# Patient Record
Sex: Male | Born: 1995 | Race: White | Hispanic: No | Marital: Single | Smoking: Current every day smoker
Health system: Southern US, Community
[De-identification: ages and names within clinical notes are randomized; demographics above are authoritative.]

## PROBLEM LIST (undated history)

## (undated) DIAGNOSIS — F319 Bipolar disorder, unspecified: Secondary | ICD-10-CM

## (undated) DIAGNOSIS — F209 Schizophrenia, unspecified: Secondary | ICD-10-CM

## (undated) DIAGNOSIS — F909 Attention-deficit hyperactivity disorder, unspecified type: Secondary | ICD-10-CM

---
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
## 2016-07-11 ENCOUNTER — Emergency Department
Admission: EM | Admit: 2016-07-11 | Discharge: 2016-07-12 | Disposition: A | Discharge status: Home / Self Care | Payer: No Typology Code available for payment source | Attending: Emergency Medicine | Admitting: Emergency Medicine

## 2016-07-11 ENCOUNTER — Encounter: Payer: Self-pay | Admitting: Emergency Medicine

## 2016-07-11 DIAGNOSIS — S060X0A Concussion without loss of consciousness, initial encounter: Secondary | ICD-10-CM | POA: Diagnosis not present

## 2016-07-11 DIAGNOSIS — F17210 Nicotine dependence, cigarettes, uncomplicated: Secondary | ICD-10-CM | POA: Insufficient documentation

## 2016-07-11 DIAGNOSIS — Y939 Activity, unspecified: Secondary | ICD-10-CM | POA: Diagnosis not present

## 2016-07-11 DIAGNOSIS — Y999 Unspecified external cause status: Secondary | ICD-10-CM | POA: Insufficient documentation

## 2016-07-11 DIAGNOSIS — Y92410 Unspecified street and highway as the place of occurrence of the external cause: Secondary | ICD-10-CM | POA: Diagnosis not present

## 2016-07-11 DIAGNOSIS — S0990XA Unspecified injury of head, initial encounter: Secondary | ICD-10-CM | POA: Diagnosis present
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
## 2016-07-12 ENCOUNTER — Emergency Department: Payer: No Typology Code available for payment source

## 2016-07-12 MED ORDER — ONDANSETRON 4 MG PO TBDP
4.0000 mg | ORAL_TABLET | Freq: Once | ORAL | Status: AC
  Administered 2016-07-12: 4 mg via ORAL

## 2016-07-12 MED ORDER — ONDANSETRON 4 MG PO TBDP
ORAL_TABLET | ORAL | Status: AC
  Filled 2016-07-12: qty 1

## 2016-07-12 MED ORDER — OXYCODONE-ACETAMINOPHEN 5-325 MG PO TABS
1.0000 | ORAL_TABLET | Freq: Once | ORAL | Status: AC
  Administered 2016-07-12: 1 via ORAL
  Filled 2016-07-12: qty 1

## 2016-07-12 MED ORDER — CYCLOBENZAPRINE HCL 10 MG PO TABS: 10.0000 mg | ORAL_TABLET | Freq: Three times a day (TID) | ORAL | 0 refills | Status: DC | PRN
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
## 2016-09-15 ENCOUNTER — Emergency Department
Admission: EM | Admit: 2016-09-15 | Discharge: 2016-09-15 | Discharge status: Against Medical Advice | Payer: Self-pay | Attending: Emergency Medicine | Admitting: Emergency Medicine

## 2016-09-15 DIAGNOSIS — L509 Urticaria, unspecified: Secondary | ICD-10-CM

## 2016-09-15 DIAGNOSIS — T7840XA Allergy, unspecified, initial encounter: Secondary | ICD-10-CM

## 2016-09-15 DIAGNOSIS — L500 Allergic urticaria: Secondary | ICD-10-CM | POA: Insufficient documentation

## 2016-09-15 DIAGNOSIS — F17210 Nicotine dependence, cigarettes, uncomplicated: Secondary | ICD-10-CM | POA: Insufficient documentation

## 2016-09-15 MED ORDER — DIPHENHYDRAMINE HCL 50 MG/ML IJ SOLN
INTRAMUSCULAR | Status: AC
  Administered 2016-09-15: 50 mg via INTRAVENOUS
  Filled 2016-09-15: qty 1

## 2016-09-15 MED ORDER — EPINEPHRINE 0.3 MG/0.3ML IJ SOAJ: 0.3000 mg | Freq: Once | INTRAMUSCULAR | 1 refills | Status: AC

## 2016-09-15 MED ORDER — DIPHENHYDRAMINE HCL 25 MG PO CAPS
ORAL_CAPSULE | ORAL | Status: AC
  Filled 2016-09-15: qty 1

## 2016-09-15 MED ORDER — DIPHENHYDRAMINE HCL 50 MG/ML IJ SOLN
50.0000 mg | Freq: Once | INTRAMUSCULAR | Status: AC
  Administered 2016-09-15: 50 mg via INTRAVENOUS

## 2016-09-15 NOTE — ED Provider Notes (Signed)
[facility] Emergency Department Provider Note  ____________________________________________  Time seen: Approximately 4:21 AM  I have reviewed the triage vital signs and the nursing notes.   HISTORY  Chief Complaint Rash   HPI Eddie Garcia is a 20 y.o. 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 The patient reports that he noticed hives all over his body starting at 9 PM. He does not remember eating anything with red dye. He denies any new soaps, detergents, body lotions, no medications, no new foods, no insect bites. Patient is also complaining of mild chest tightness and shortness of breath. He has never had anaphylaxis reaction in the past requiring epinephrine. He reports that he didn't take Benadryl at home because he was a only able to find one with red dye. Patient denies tongue or lip swelling, nausea or vomiting, diarrhea.  No past medical history on file.  There are no active problems to display for this patient.   No past surgical history on file.  Prior to Admission medications   Medication Sig Start Date End Date Taking? Authorizing Provider  cyclobenzaprine (FLEXERIL) 10 MG tablet Take 1 tablet (10 mg total) by mouth 3 (three) times daily as needed. 07/12/16   Darci Currentandolph N Brown, MD  EPINEPHrine 0.3 mg/0.3 mL IJ SOAJ injection Inject 0.3 mLs (0.3 mg total) into the muscle once. 09/15/16 09/15/16  Nita Sicklearolina Deondra Wigger, MD    Allergies Red dye  No family history on file.  Social History Social History  Substance Use Topics  . Smoking status: Current Every Day Smoker    Packs/day: 0.50    Types: Cigarettes  . Smokeless tobacco: Never Used  . Alcohol use Yes    Review of Systems Constitutional: Negative for fever. Eyes: Negative for visual changes. ENT: Negative for sore throat. Neck: No neck pain  Cardiovascular: Negative for chest pain. Respiratory: + shortness of  breath. Gastrointestinal: Negative for abdominal pain, vomiting or diarrhea. Genitourinary: Negative for dysuria. Musculoskeletal: Negative for back pain. Skin: + hives Neurological: Negative for headaches, weakness or numbness. Psych: No SI or HI  ____________________________________________   PHYSICAL EXAM:  VITAL SIGNS: ED Triage Vitals [09/15/16 0127]  Enc Vitals Group     BP 126/76     Pulse Rate 89     Resp 18     Temp 97.5 F (36.4 C)     Temp Source Oral     SpO2 100 %     Weight 180 lb (81.6 kg)     Height 6\' 5"  (1.956 m)     Head Circumference      Peak Flow      Pain Score      Pain Loc      Pain Edu?      Excl. in GC?     Constitutional: Alert and oriented. Well appearing and in no apparent distress. HEENT:      Head: Normocephalic and atraumatic.         Eyes: Conjunctivae are normal. Sclera is non-icteric. EOMI. PERRL      Mouth/Throat: Mucous membranes are moist. Oropharynx is clear, tongue and lips are normal size, uvula is midline with no swelling or edema, no stridor      Neck: Supple with no signs of meningismus. Cardiovascular: Regular rate and rhythm. No murmurs, gallops, or rubs. 2+ symmetrical distal pulses are present in all extremities. No JVD. Respiratory: Normal respiratory effort. Lungs are clear to auscultation  bilaterally. No wheezes, crackles, or rhonchi.  Gastrointestinal: Soft, non tender, and non distended with positive bowel sounds. No rebound or guarding. Genitourinary: No CVA tenderness. Musculoskeletal: Nontender with normal range of motion in all extremities. No edema, cyanosis, or erythema of extremities. Neurologic: Normal speech and language. Face is symmetric. Moving all extremities. No gross focal neurologic deficits are appreciated. Skin: Diffuse hives Psychiatric: Mood and affect are normal. Speech and behavior are normal.  ____________________________________________   LABS (all labs ordered are listed, but only  abnormal results are displayed)  Labs Reviewed - No data to display ____________________________________________  EKG  none  ____________________________________________  RADIOLOGY  none  ____________________________________________   PROCEDURES  Procedure(s) performed: None Procedures Critical Care performed:  None ____________________________________________   INITIAL IMPRESSION / ASSESSMENT AND PLAN / ED COURSE  20 y.o. 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 Patient received Benadryl in the waiting room and reports that his hives feel markedly improved however he still complaining of mild chest tightness and shortness of breath. Patient's exam is with no acute findings, airways patent, no stridor, no wheezing however did recommend EpiPen and monitoring for a few hours in the emergency room. Patient told me that " he does not have time to stay here being monitored and that he had waited for 3 hours and if anything bad was going to happen it would have happened already". Patient waited to be seen because he wants a note for work. I explained to him the dangers of going home with an anaphylaxis reaction without appropriate treatment especially since patient does not have an EpiPen with him. I explained to him that the anaphylactic symptoms can get worse for many hours since the exposure and just because he waited 3 hours in the waiting room that does not clear him for possible anaphylaxis and airway obstruction. Patient understands these recommendations and continues to demand to be discharged. Patient will be given a prescription for an EpiPen. I explained to patient how to use it. Recommended that he continue to take Benadryl at home for his hives. Recommended that he follow-up with his PCP to be referred to an allergist. Also encouraged patient to return at any time to the emergency room if he changes his mind and  decides that he wants to continue his care.  09/15/2016 at 4:27 AM:  The patient requested to leave.  I considered this to be leaving against medical advice. I personally discussed the following with them:  1)  That they currently had a medical condition of hives and I am concerned that they may have anaphylaxis.    2)  My proposed course of evaluation and treatment includes, but is not limited to,  Epipen, cardiac monitoring.  Benefits of staying include possible diagnosis or excluding of anaphylaxis or an alternative serious condition such as impending airway, which if identified early would lead to appropriate intervention in a timely manner lessening the burden of disability and death.  3) Risks of leaving before this had been completed include: misdiagnosis, worsening illness leading up to and including prolonged or permanent disability or death.  Specific risks pertinent, but not all inclusive, of their current medical condition include but are not limited to impending airway, anaphylactic shock, respiratory failure and death.  I also discussed alternatives including monitoring in the ED.  Despite this they stated they wanted to leave due to being tired of waiting and refused further  evaluation, treatment, or admission at this time.   They appeared clinically sober, were mentating appropriately, were free from distracting injury, had adequately controlled acute pain, appeared to have intact insight, judgment, and reason, and in my opinion had the capacity to make this decision.  Specifically, they were able to verbally state back in a coherent manner their current medical condition/current diagnosis, the proposed course of evaluation and/or treatment, and the risks, benefits, and alternatives of treatment versus leaving against medical advice.   They understand that they may return to seek medical attention here at ANY time they want.  I strongly advised them to return to the Emergency  Department immediately if they experience any new or worsening symptoms that concern them, or simply if they reconsider continued evaluation and/or treatment as previously discussed.  This would be without any repercussions, though they understand they likely will need to wait again in the Emergency Department if other patients are in front of them, rather than being brought straight back.  They understood this is another advantage of staying, but still insisted upon leaving.  I recommended they follow-up with his doctor at the earliest available opportunity/appointment for further evaluation and treatment.   The patient was discharged against medical advice.  They did accept written discharge instructions.    Clinical Course     Pertinent labs & imaging results that were available during my care of the patient were reviewed by me and considered in my medical decision making (see chart for details).    ____________________________________________   FINAL CLINICAL IMPRESSION(S) / ED DIAGNOSES  Final diagnoses:  Hives  Allergic reaction, initial encounter      NEW MEDICATIONS STARTED DURING THIS VISIT:  New Prescriptions   EPINEPHRINE 0.3 MG/0.3 ML IJ SOAJ INJECTION    Inject 0.3 mLs (0.3 mg total) into the muscle once.     Note:  This document was prepared using Dragon voice recognition software and may include unintentional dictation errors.    Nita Sicklearolina Renise Gillies, MD 09/15/16 [phone]

## 2016-09-15 NOTE — ED Notes (Signed)
Pt co shob, reevaluated in triage. No swelling noted to face or mouth at this time, sats 100%.

## 2016-09-15 NOTE — Discharge Instructions (Signed)
Fill the prescription for the EpiPen and carry with you at all times. Administer to the lateral thigh and hold in place for 10 seconds like I explained to you if you have any difficulty breathing, sensation that your throat is closing, swelling of your lips or tongue. If you use the EpiPen you should call 911 immediately. Return to the emergency room at any time for continuation of your care.

## 2016-09-15 NOTE — ED Triage Notes (Signed)
Pt In with co hives since 2100 all over, states unknown cause.

## 2016-10-28 ENCOUNTER — Emergency Department
Admission: EM | Admit: 2016-10-28 | Discharge: 2016-10-28 | Disposition: A | Discharge status: Home / Self Care | Payer: BLUE CROSS/BLUE SHIELD | Attending: Emergency Medicine | Admitting: Emergency Medicine

## 2016-10-28 ENCOUNTER — Encounter: Payer: Self-pay | Admitting: Medical Oncology

## 2016-10-28 DIAGNOSIS — L509 Urticaria, unspecified: Secondary | ICD-10-CM

## 2016-10-28 DIAGNOSIS — F17210 Nicotine dependence, cigarettes, uncomplicated: Secondary | ICD-10-CM | POA: Diagnosis not present

## 2016-10-28 DIAGNOSIS — R21 Rash and other nonspecific skin eruption: Secondary | ICD-10-CM | POA: Diagnosis present

## 2016-10-28 MED ORDER — PREDNISONE 10 MG PO TABS: 10.0000 mg | ORAL_TABLET | Freq: Two times a day (BID) | ORAL | 0 refills | Status: DC

## 2016-10-28 MED ORDER — CYPROHEPTADINE HCL 4 MG PO TABS
4.0000 mg | ORAL_TABLET | Freq: Once | ORAL | Status: AC
  Administered 2016-10-28: 4 mg via ORAL
  Filled 2016-10-28: qty 1

## 2016-10-28 MED ORDER — FAMOTIDINE 20 MG PO TABS
20.0000 mg | ORAL_TABLET | Freq: Two times a day (BID) | ORAL | 0 refills | Status: DC
Start: 2016-10-28 — End: 2016-12-25

## 2016-10-28 MED ORDER — CYPROHEPTADINE HCL 4 MG PO TABS: 4.0000 mg | ORAL_TABLET | Freq: Three times a day (TID) | ORAL | 0 refills | Status: DC | PRN

## 2016-10-28 MED ORDER — DEXAMETHASONE SODIUM PHOSPHATE 10 MG/ML IJ SOLN
10.0000 mg | Freq: Once | INTRAMUSCULAR | Status: AC
  Administered 2016-10-28: 10 mg via INTRAMUSCULAR
  Filled 2016-10-28: qty 1

## 2016-10-28 MED ORDER — FAMOTIDINE 20 MG PO TABS
40.0000 mg | ORAL_TABLET | Freq: Once | ORAL | Status: AC
  Administered 2016-10-28: 40 mg via ORAL
  Filled 2016-10-28: qty 2

## 2016-10-28 NOTE — Discharge Instructions (Signed)
You are being treated for a hive reaction to presumed exposure to red dye, a known allergen for you. You should be careful to read labels on food and snacks packages. Avoid processed foods as much as possible. Take the prescription antihistamines as directed. Take the steroid as prescribed. You may dose the antihistamines until symptoms resolve. Take them as needed for any future exposures. Drink fluids to reduce symptoms. [facility] or return to the ED as needed.

## 2016-10-28 NOTE — ED Notes (Signed)
Pt verbalized understanding of discharge instructions. NAD at this time. 

## 2016-10-28 NOTE — ED Triage Notes (Signed)
Pt reports rash all over that began this am, rash is itchy. Pt denies any resp distress.

## 2016-10-29 NOTE — ED Provider Notes (Signed)
[facility] Emergency Department Provider Note ____________________________________________  Time seen: 1218  I have reviewed the triage vital signs and the nursing notes.  HISTORY  Chief Complaint  Rash  HPI Eddie Garcia is a 21 y.o. male presents to the ED for evaluation of a rash that began over his body yesterday. Patient describes the rash as itchy and notes the exposure was to red dye and a bag of chips that he ate earlier. 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 He was seen in the ED2 months ago for similar allergic exposure. The patient is to have dosed Benadryl yesterday 1 dose. He denies any difficulty breathing, swallowing, or controlling oral secretions. He has not taken any continued antihistamine blockade given his symptoms.  History reviewed. No pertinent past medical history.  There are no active problems to display for this patient.  History reviewed. No pertinent surgical history.  Prior to Admission medications   Medication Sig Start Date End Date Taking? Authorizing Provider  cyclobenzaprine (FLEXERIL) 10 MG tablet Take 1 tablet (10 mg total) by mouth 3 (three) times daily as needed. 07/12/16   Darci Current, MD  cyproheptadine (PERIACTIN) 4 MG tablet Take 1 tablet (4 mg total) by mouth 3 (three) times daily as needed for allergies. 10/28/16   [place] Anagnos V Bacon Mulki Roesler, PA-C  famotidine (PEPCID) 20 MG tablet Take 1 tablet (20 mg total) by mouth 2 (two) times daily. 10/28/16 11/07/16  Rashid Whitenight V Bacon Jakylan Ron, PA-C  predniSONE (DELTASONE) 10 MG tablet Take 1 tablet (10 mg total) by mouth 2 (two) times daily with a meal. 10/28/16   Tynleigh Birt V Bacon Dao Mearns, PA-C    Allergies Red dye  No family history on file.  Social History Social History  Substance Use Topics  . Smoking status: Current Every Day Smoker    Packs/day: 0.50    Types: Cigarettes  . Smokeless tobacco: Never Used  . Alcohol use Yes    Review of  Systems  Constitutional: Negative for fever. Eyes: Negative for visual changes. ENT: Negative for sore throat. Cardiovascular: Negative for chest pain. Respiratory: Negative for shortness of breath. Gastrointestinal: Negative for abdominal pain, vomiting and diarrhea. Skin: Positive for rash. Neurological: Negative for headaches, focal weakness or numbness. ____________________________________________  PHYSICAL EXAM:  VITAL SIGNS: ED Triage Vitals  Enc Vitals Group     BP 10/28/16 1025 125/64     Pulse Rate 10/28/16 1025 91     Resp 10/28/16 1025 18     Temp 10/28/16 1025 98.1 F (36.7 C)     Temp Source 10/28/16 1025 Oral     SpO2 10/28/16 1025 97 %     Weight 10/28/16 1024 175 lb (79.4 kg)     Height 10/28/16 1024 6\' 5"  (1.956 m)     Head Circumference --      Peak Flow --      Pain Score --      Pain Loc --      Pain Edu? --      Excl. in GC? --     Constitutional: Alert and oriented. Well appearing and in no distress. Head: Normocephalic and atraumatic. Eyes: Conjunctivae are normal. PERRL. Normal extraocular movements Ears: Canals clear. TMs intact bilaterally. Nose: No congestion/rhinorrhea/epistaxis. Mouth/Throat: Mucous membranes are moist.Uvula is midline and tonsils are flat. No oropharyngeal erythema, edema, or exudate noted. Neck: Supple. No thyromegaly. Hematological/Lymphatic/Immunological: No cervical lymphadenopathy. Cardiovascular: Normal rate, regular rhythm. Normal distal pulses. Respiratory: Normal respiratory  effort. No wheezes/rales/rhonchi. Musculoskeletal: Nontender with normal range of motion in all extremities.  Neurologic:  Normal gait without ataxia. Normal speech and language. No gross focal neurologic deficits are appreciated. Skin:  Skin is warm, dry and intact. Patient noted to have scattered, erythematous, maculopapular whelps over the torso and extremities. ____________________________________________  PROCEDURES  Periactin 4 mg  PO Decadron 10 mg IM Famotidine 40 mg PO ____________________________________________  INITIAL IMPRESSION / ASSESSMENT AND PLAN / ED COURSE  Patient with an acute allergic reaction secondary to exposure to red food dye. The red eyes and no admission for the patient and he admittedly ate a package of potato chips which she knows contains a same food dye product. He is discharged at this time with instructions to continue antihistamine blockade. He is provided with prescriptions for Periactin, famotidine, and prednisone. He will [facility] provider [facility]. He should return to the ED for acute restaurant distress. ____________________________________________  FINAL CLINICAL IMPRESSION(S) / ED DIAGNOSES  Final diagnoses:  Hives      Lissa HoardJenise V Bacon Anevay Campanella, PA-C 10/30/16 1650    Myrna Blazeravid Matthew Schaevitz, MD 10/31/16 [phone]

## 2016-11-27 ENCOUNTER — Emergency Department: Payer: BLUE CROSS/BLUE SHIELD

## 2016-11-27 ENCOUNTER — Emergency Department
Admission: EM | Admit: 2016-11-27 | Discharge: 2016-11-27 | Disposition: A | Discharge status: Home / Self Care | Payer: BLUE CROSS/BLUE SHIELD | Attending: Emergency Medicine | Admitting: Emergency Medicine

## 2016-11-27 ENCOUNTER — Encounter: Payer: Self-pay | Admitting: Emergency Medicine

## 2016-11-27 DIAGNOSIS — R1031 Right lower quadrant pain: Secondary | ICD-10-CM | POA: Diagnosis present

## 2016-11-27 DIAGNOSIS — F17210 Nicotine dependence, cigarettes, uncomplicated: Secondary | ICD-10-CM | POA: Diagnosis not present

## 2016-11-27 LAB — URINALYSIS, COMPLETE (UACMP) WITH MICROSCOPIC
Bacteria, UA: NONE SEEN
Bilirubin Urine: NEGATIVE
GLUCOSE, UA: NEGATIVE mg/dL
HGB URINE DIPSTICK: NEGATIVE
Ketones, ur: NEGATIVE mg/dL
Nitrite: NEGATIVE
Protein, ur: NEGATIVE mg/dL
SPECIFIC GRAVITY, URINE: 1.02 (ref 1.005–1.030)
pH: 8 (ref 5.0–8.0)

## 2016-11-27 LAB — COMPREHENSIVE METABOLIC PANEL
ALT: 9 U/L — AB (ref 17–63)
AST: 15 U/L (ref 15–41)
Albumin: 4 g/dL (ref 3.5–5.0)
Alkaline Phosphatase: 59 U/L (ref 38–126)
Anion gap: 6 (ref 5–15)
BUN: 14 mg/dL (ref 6–20)
CALCIUM: 8.8 mg/dL — AB (ref 8.9–10.3)
CO2: 31 mmol/L (ref 22–32)
CREATININE: 0.83 mg/dL (ref 0.61–1.24)
Chloride: 101 mmol/L (ref 101–111)
GFR calc Af Amer: >60 mL/min (ref >60)
Glucose, Bld: 101 mg/dL — ABNORMAL HIGH (ref 65–99)
Potassium: 4.1 mmol/L (ref 3.5–5.1)
Sodium: 138 mmol/L (ref 135–145)
Total Bilirubin: 0.3 mg/dL (ref 0.3–1.2)
Total Protein: 7.2 g/dL (ref 6.5–8.1)

## 2016-11-27 LAB — DIFFERENTIAL
Basophils Absolute: 0 10*3/uL (ref 0–0.1)
Basophils Relative: 0 %
EOS PCT: 2 %
Eosinophils Absolute: 0.3 10*3/uL (ref 0–0.7)
LYMPHS ABS: 2.4 10*3/uL (ref 1.0–3.6)
LYMPHS PCT: 18 %
Monocytes Absolute: 0.4 10*3/uL (ref 0.2–1.0)
Monocytes Relative: 3 %
NEUTROS PCT: 77 %
Neutro Abs: 9.7 10*3/uL — ABNORMAL HIGH (ref 1.4–6.5)

## 2016-11-27 LAB — CBC
HCT: 45.5 % (ref 40.0–52.0)
Hemoglobin: 15.2 g/dL (ref 13.0–18.0)
MCH: 28.4 pg (ref 26.0–34.0)
MCHC: 33.4 g/dL (ref 32.0–36.0)
MCV: 85.1 fL (ref 80.0–100.0)
PLATELETS: 455 10*3/uL — AB (ref 150–440)
RBC: 5.35 MIL/uL (ref 4.40–5.90)
RDW: 14.4 % (ref 11.5–14.5)
WBC: 13 10*3/uL — AB (ref 3.8–10.6)

## 2016-11-27 LAB — CHLAMYDIA/NGC RT PCR (ARMC ONLY)
CHLAMYDIA TR: NOT DETECTED
N GONORRHOEAE: NOT DETECTED

## 2016-11-27 LAB — LIPASE, BLOOD: Lipase: <10 U/L — ABNORMAL LOW (ref 11–51)

## 2016-11-27 MED ORDER — IOPAMIDOL (ISOVUE-300) INJECTION 61%
100.0000 mL | Freq: Once | INTRAVENOUS | Status: AC | PRN
  Administered 2016-11-27: 100 mL via INTRAVENOUS

## 2016-11-27 MED ORDER — METRONIDAZOLE 500 MG PO TABS
500.0000 mg | ORAL_TABLET | Freq: Once | ORAL | Status: AC
  Administered 2016-11-27: 500 mg via ORAL
  Filled 2016-11-27: qty 1

## 2016-11-27 MED ORDER — METRONIDAZOLE 500 MG PO TABS: 500.0000 mg | ORAL_TABLET | Freq: Three times a day (TID) | ORAL | 0 refills | Status: AC

## 2016-11-27 MED ORDER — MORPHINE SULFATE (PF) 4 MG/ML IV SOLN: 4.0000 mg | Freq: Once | INTRAVENOUS | Status: DC

## 2016-11-27 MED ORDER — CIPROFLOXACIN HCL 500 MG PO TABS: 500.0000 mg | ORAL_TABLET | Freq: Two times a day (BID) | ORAL | 0 refills | Status: AC

## 2016-11-27 MED ORDER — ONDANSETRON HCL 4 MG/2ML IJ SOLN
4.0000 mg | Freq: Once | INTRAMUSCULAR | Status: AC
  Administered 2016-11-27: 4 mg via INTRAVENOUS
  Filled 2016-11-27: qty 2

## 2016-11-27 MED ORDER — IOPAMIDOL (ISOVUE-300) INJECTION 61%
30.0000 mL | Freq: Once | INTRAVENOUS | Status: AC | PRN
  Administered 2016-11-27: 30 mL via ORAL

## 2016-11-27 MED ORDER — CIPROFLOXACIN HCL 500 MG PO TABS
500.0000 mg | ORAL_TABLET | Freq: Once | ORAL | Status: AC
  Administered 2016-11-27: 500 mg via ORAL
  Filled 2016-11-27: qty 1

## 2016-11-27 NOTE — ED Triage Notes (Signed)
Nausea, vomiting and abdominal pain since yesterday.  

## 2016-11-27 NOTE — ED Provider Notes (Signed)
[facility] Emergency Department Provider Note   ____________________________________________   First MD Initiated Contact with Patient 11/27/16 1707     (approximate)  I have reviewed the triage vital signs and the nursing notes.   HISTORY  Chief Complaint Abdominal Pain    HPI Eddie Garcia is a 21 y.o. male who reports abdominal pain and nausea and vomiting getting worse and just today. The pain seems to have localized itself in the right lower quadrant. There was made somewhat worse by walking and the bumps in the road. It is moderate in severity. Kind of achy in quality. She has never had this before.  History reviewed. No pertinent past medical history.  There are no active problems to display for this patient.   History reviewed. No pertinent surgical history.  Prior to Admission medications   Medication Sig Start Date End Date Taking? Authorizing Provider  ciprofloxacin (CIPRO) 500 MG tablet Take 1 tablet (500 mg total) by mouth 2 (two) times daily. 11/27/16 12/07/16  Arnaldo Natal, MD  cyclobenzaprine (FLEXERIL) 10 MG tablet Take 1 tablet (10 mg total) by mouth 3 (three) times daily as needed. Patient not taking: Reported on 11/27/2016 07/12/16   Darci Current, MD  cyproheptadine (PERIACTIN) 4 MG tablet Take 1 tablet (4 mg total) by mouth 3 (three) times daily as needed for allergies. Patient not taking: Reported on 11/27/2016 10/28/16   Charlesetta Ivory Menshew, PA-C  famotidine (PEPCID) 20 MG tablet Take 1 tablet (20 mg total) by mouth 2 (two) times daily. 10/28/16 11/07/16  Jenise V Bacon Menshew, PA-C  metroNIDAZOLE (FLAGYL) 500 MG tablet Take 1 tablet (500 mg total) by mouth 3 (three) times daily. 11/27/16 12/11/16  Arnaldo Natal, MD  predniSONE (DELTASONE) 10 MG tablet Take 1 tablet (10 mg total) by mouth 2 (two) times daily with a meal. Patient not taking: Reported on 11/27/2016 10/28/16   Charlesetta Ivory Menshew, PA-C    Allergies Red  dye  No family history on file.  Social History Social History  Substance Use Topics  . Smoking status: Current Every Day Smoker    Packs/day: 0.25    Types: Cigarettes  . Smokeless tobacco: Never Used  . Alcohol use Yes    Review of Systems Constitutional: No fever/chills Eyes: No visual changes. ENT: No sore throat. Cardiovascular: Denies chest pain. Respiratory: Denies shortness of breath. Gastrointestinal: . See history of present illness Genitourinary: Negative for dysuria. Musculoskeletal: Negative for back pain. Skin: Negative for rash. Neurological: Negative for headaches, focal weakness or numbness.  10-point ROS otherwise negative.  ____________________________________________   PHYSICAL EXAM:  VITAL SIGNS: ED Triage Vitals  Enc Vitals Group     BP 11/27/16 1634 127/72     Pulse Rate 11/27/16 1634 73     Resp 11/27/16 1634 20     Temp 11/27/16 1634 98.1 F (36.7 C)     Temp Source 11/27/16 1634 Oral     SpO2 11/27/16 1634 100 %     Weight 11/27/16 1634 175 lb (79.4 kg)     Height 11/27/16 1634 6\' 5"  (1.956 m)     Head Circumference --      Peak Flow --      Pain Score 11/27/16 1635 4     Pain Loc --      Pain Edu? --      Excl. in GC? --     Constitutional: Alert and oriented. Well appearing and in no  acute distress. Eyes: Conjunctivae are normal. PERRL. EOMI. Head: Atraumatic. Nose: No congestion/rhinnorhea. Mouth/Throat: Mucous membranes are moist.  Oropharynx non-erythematous. Neck: No stridor.  Cardiovascular: Normal rate, regular rhythm. Grossly normal heart sounds.  Good peripheral circulation. Respiratory: Normal respiratory effort.  No retractions. Lungs CTAB. Gastrointestinal: SoftTender to palpation percussion right lower quadrant. Palpation left lower quadrant causes right lower quadrant pain. No distention. No abdominal bruits. No CVA tenderness. Musculoskeletal: No lower extremity tenderness nor edema.  No joint  effusions. Neurologic:  Normal speech and language. No gross focal neurologic deficits are appreciated. No gait instability. Skin:  Skin is warm, dry and intact. No rash noted.   ____________________________________________   LABS (all labs ordered are listed, but only abnormal results are displayed)  Labs Reviewed  LIPASE, BLOOD - Abnormal; Notable for the following:       Result Value   Lipase <10 (*)    All other components within normal limits  COMPREHENSIVE METABOLIC PANEL - Abnormal; Notable for the following:    Glucose, Bld 101 (*)    Calcium 8.8 (*)    ALT 9 (*)    All other components within normal limits  CBC - Abnormal; Notable for the following:    WBC 13.0 (*)    Platelets 455 (*)    All other components within normal limits  URINALYSIS, COMPLETE (UACMP) WITH MICROSCOPIC - Abnormal; Notable for the following:    Color, Urine YELLOW (*)    APPearance TURBID (*)    Leukocytes, UA SMALL (*)    Squamous Epithelial / LPF 0-5 (*)    All other components within normal limits  DIFFERENTIAL - Abnormal; Notable for the following:    Neutro Abs 9.7 (*)    All other components within normal limits  CHLAMYDIA/NGC RT PCR ([facility] ONLY)   ____________________________________________  EKG   ____________________________________________  RADIOLOGY  ____________________________________________   PROCEDURES  Procedure(s) performed:   Procedures  Critical Care performed:   ____________________________________________   INITIAL IMPRESSION / ASSESSMENT AND PLAN / ED COURSE  Pertinent labs & imaging results that were available during my care of the patient were reviewed by me and considered in my medical decision making (see chart for details).   Discussed with surgeon on call who reviewed the CT scan. CT he agrees looks normal we'll treat the patient with antibiotics for his white cells in his urine. Patient denies any symptoms whatsoever except for the  right lower quadrant pain. The right lower quadrant remains tender on reexam he denies any suprapubic pain or CVA pain on exam he has no discharge no dysuria no urgency no frequency. He will return tomorrow for recheck. There are no surgeon's clinic tomorrow.     ____________________________________________   FINAL CLINICAL IMPRESSION(S) / ED DIAGNOSES  Final diagnoses:  Right lower quadrant abdominal pain      NEW MEDICATIONS STARTED DURING THIS VISIT:  Discharge Medication List as of 11/27/2016  8:08 PM    START taking these medications   Details  ciprofloxacin (CIPRO) 500 MG tablet Take 1 tablet (500 mg total) by mouth 2 (two) times daily., Starting Mon 11/27/2016, Until Thu 12/07/2016, Print    metroNIDAZOLE (FLAGYL) 500 MG tablet Take 1 tablet (500 mg total) by mouth 3 (three) times daily., Starting Mon 11/27/2016, Until Mon 12/11/2016, Print         Note:  This document was prepared using Dragon voice recognition software and may include unintentional dictation errors.    Arnaldo NatalPaul F Caidence Kaseman, MD 11/27/16 2131

## 2016-11-27 NOTE — ED Notes (Signed)
Pt back from CT

## 2016-11-27 NOTE — ED Notes (Signed)
Pt to provide urine sample and call this RN when done.

## 2016-11-27 NOTE — ED Notes (Signed)
Pt discharged to home.  Family member driving.  Discharge instructions reviewed.  Verbalized understanding.  No questions or concerns at this time.  Teach back verified.  Pt in NAD.  No items left in ED.   

## 2016-11-27 NOTE — Discharge Instructions (Signed)
Please take the Cipro and Flagyl as directed since you have all the white blood cells in your urine. Please return here tomorrow for recheck. There is nobody in the surgical clinic tomorrow. Please return sooner if you're worse. Use Tylenol or Motrin for the pain for now I don't want to give you anything stronger at present.

## 2016-11-29 ENCOUNTER — Emergency Department
Admission: EM | Admit: 2016-11-29 | Discharge: 2016-11-29 | Disposition: A | Discharge status: Home / Self Care | Payer: BLUE CROSS/BLUE SHIELD | Attending: Emergency Medicine | Admitting: Emergency Medicine

## 2016-11-29 DIAGNOSIS — R1031 Right lower quadrant pain: Secondary | ICD-10-CM

## 2016-11-29 DIAGNOSIS — F17210 Nicotine dependence, cigarettes, uncomplicated: Secondary | ICD-10-CM | POA: Diagnosis not present

## 2016-11-29 DIAGNOSIS — T7840XA Allergy, unspecified, initial encounter: Secondary | ICD-10-CM | POA: Diagnosis not present

## 2016-11-29 DIAGNOSIS — Z79899 Other long term (current) drug therapy: Secondary | ICD-10-CM | POA: Insufficient documentation

## 2016-11-29 MED ORDER — DIPHENHYDRAMINE HCL 50 MG/ML IJ SOLN
50.0000 mg | Freq: Once | INTRAMUSCULAR | Status: DC
  Filled 2016-11-29: qty 1

## 2016-11-29 MED ORDER — DIPHENHYDRAMINE HCL 50 MG/ML IJ SOLN: 50.0000 mg | Freq: Once | INTRAMUSCULAR | Status: DC

## 2016-11-29 MED ORDER — PREDNISONE 20 MG PO TABS
60.0000 mg | ORAL_TABLET | Freq: Once | ORAL | Status: AC
  Administered 2016-11-29: 60 mg via ORAL
  Filled 2016-11-29: qty 3

## 2016-11-29 NOTE — ED Notes (Signed)
Pt now noted sitting back in lobby; pt returned to triage waiting

## 2016-11-29 NOTE — ED Notes (Signed)
No answer when called several times from lobby 

## 2016-11-29 NOTE — ED Notes (Signed)
No new protocols to be done per Dr. Manson PasseyBrown

## 2016-11-29 NOTE — ED Triage Notes (Signed)
Pt in with co rlq pain x 2 days was seen here last night for the same and all tests were wnl. Was told to come back tonight for recheck since surgeons clinics were not open. Pt has not gotten antibiotics filled.

## 2016-12-01 NOTE — ED Provider Notes (Signed)
[facility] Emergency Department Provider Note   First MD Initiated Contact with Patient 11/29/16 [phone]     (approximate)  I have reviewed the triage vital signs and the nursing notes.   HISTORY  Chief Complaint Abdominal Pain    HPI Eddie Garcia is a 21 y.o. male returns to the emergency department will repeat evaluation for lower quadrant pain at the patient's affect the past 2 days. Patient states pain is improved however not resolved. Patient denies any vomiting or diarrhea. Patient denies any constipation or fever afebrile on presentation temperature 98.3 patient was evaluated by Dr. Darnelle Catalan on 11/27/2016 with a negative CT scan of the abdomen and pelvis performed that time notably for a normal appendix and report. In addition patient states that he has also have an allergic reaction with hives noted on his left arm and chest. Patient states that he's had abdominal discomfort with allergic reactions in the past. Patient pain score 4 out of 10  No past medical history on file.  There are no active problems to display for this patient.   No past surgical history on file.  Prior to Admission medications   Medication Sig Start Date End Date Taking? Authorizing Provider  ciprofloxacin (CIPRO) 500 MG tablet Take 1 tablet (500 mg total) by mouth 2 (two) times daily. 11/27/16 12/07/16  Arnaldo Natal, MD  cyclobenzaprine (FLEXERIL) 10 MG tablet Take 1 tablet (10 mg total) by mouth 3 (three) times daily as needed. Patient not taking: Reported on 11/27/2016 07/12/16   Darci Current, MD  cyproheptadine (PERIACTIN) 4 MG tablet Take 1 tablet (4 mg total) by mouth 3 (three) times daily as needed for allergies. Patient not taking: Reported on 11/27/2016 10/28/16   Charlesetta Ivory Menshew, PA-C  famotidine (PEPCID) 20 MG tablet Take 1 tablet (20 mg total) by mouth 2 (two) times daily. 10/28/16 11/07/16  Jenise V Bacon Menshew, PA-C  metroNIDAZOLE (FLAGYL) 500 MG tablet Take  1 tablet (500 mg total) by mouth 3 (three) times daily. 11/27/16 12/11/16  Arnaldo Natal, MD  predniSONE (DELTASONE) 10 MG tablet Take 1 tablet (10 mg total) by mouth 2 (two) times daily with a meal. Patient not taking: Reported on 11/27/2016 10/28/16   Charlesetta Ivory Menshew, PA-C    Allergies Red dye  No family history on file.  Social History Social History  Substance Use Topics  . Smoking status: Current Every Day Smoker    Packs/day: 0.25    Types: Cigarettes  . Smokeless tobacco: Never Used  . Alcohol use Yes    Review of Systems Constitutional: No fever/chills Eyes: No visual changes. ENT: No sore throat. Cardiovascular: Denies chest pain. Respiratory: Denies shortness of breath. Gastrointestinal: Positive for abdominal pain.  No nausea, no vomiting.  No diarrhea.  No constipation. Genitourinary: Negative for dysuria. Musculoskeletal: Negative for back pain. Skin: Positive for rash Neurological: Negative for headaches, focal weakness or numbness.  10-point ROS otherwise negative.  ____________________________________________   PHYSICAL EXAM:  VITAL SIGNS: ED Triage Vitals  Enc Vitals Group     BP 11/29/16 0108 132/80     Pulse Rate 11/29/16 0108 91     Resp 11/29/16 0108 18     Temp 11/29/16 0108 98.3 F (36.8 C)     Temp Source 11/29/16 0108 Oral     SpO2 11/29/16 0108 100 %     Weight 11/29/16 0107 175 lb (79.4 kg)     Height 11/29/16 0107 6'  4" (1.93 m)     Head Circumference --      Peak Flow --      Pain Score 11/29/16 0108 5     Pain Loc --      Pain Edu? --      Excl. in GC? --     Constitutional: Alert and oriented. Well appearing and in no acute distress. Eyes: Conjunctivae are normal. PERRL. EOMI. Head: Atraumatic. Mouth/Throat: Mucous membranes are moist.  Oropharynx non-erythematous. Neck: No stridor.   Cardiovascular: Normal rate, regular rhythm. Good peripheral circulation. Grossly normal heart sounds. Respiratory: Normal respiratory  effort.  No retractions. Lungs CTAB. Gastrointestinal: Soft and nontender. No distention.  Musculoskeletal: No lower extremity tenderness nor edema. No gross deformities of extremities. Neurologic:  Normal speech and language. No gross focal neurologic deficits are appreciated.  Skin:  Hives noted on the patient's left arm and anterior chest wall. Psychiatric: Mood and affect are normal. Speech and behavior are normal.    Procedures      INITIAL IMPRESSION / ASSESSMENT AND PLAN / ED COURSE  Pertinent labs & imaging results that were available during my care of the patient were reviewed by me and considered in my medical decision making (see chart for details).  Given improvement of the patient's pain no worsening symptoms repeat imaging not performed.      ____________________________________________  FINAL CLINICAL IMPRESSION(S) / ED DIAGNOSES  Final diagnoses:  Allergic reaction, initial encounter  Right lower quadrant abdominal pain     MEDICATIONS GIVEN DURING THIS VISIT:  Medications  predniSONE (DELTASONE) tablet 60 mg (60 mg Oral Given 11/29/16 0526)     NEW OUTPATIENT MEDICATIONS STARTED DURING THIS VISIT:  Discharge Medication List as of 11/29/2016  5:48 AM      Discharge Medication List as of 11/29/2016  5:48 AM      Discharge Medication List as of 11/29/2016  5:48 AM       Note:  This document was prepared using Dragon voice recognition software and may include unintentional dictation errors.    Darci Currentandolph N Kelilah Hebard, MD 12/01/16 [phone]

## 2016-12-11 ENCOUNTER — Encounter: Payer: Self-pay | Admitting: *Deleted

## 2016-12-11 DIAGNOSIS — F17210 Nicotine dependence, cigarettes, uncomplicated: Secondary | ICD-10-CM | POA: Diagnosis not present

## 2016-12-11 DIAGNOSIS — T7840XA Allergy, unspecified, initial encounter: Secondary | ICD-10-CM | POA: Diagnosis not present

## 2016-12-11 NOTE — ED Triage Notes (Addendum)
Pt states having a reaction to red dye.  Pt has rash to arms, back and stomach.  No resp distress. Pt took benadryl at 1900.   Pt alert.  Speech clear.

## 2016-12-12 ENCOUNTER — Emergency Department
Admission: EM | Admit: 2016-12-12 | Discharge: 2016-12-12 | Disposition: A | Discharge status: Home / Self Care | Payer: BLUE CROSS/BLUE SHIELD | Attending: Emergency Medicine | Admitting: Emergency Medicine

## 2016-12-12 DIAGNOSIS — T7840XA Allergy, unspecified, initial encounter: Secondary | ICD-10-CM

## 2016-12-12 MED ORDER — PREDNISONE 20 MG PO TABS: 60.0000 mg | ORAL_TABLET | Freq: Every day | ORAL | 0 refills | Status: AC

## 2016-12-12 NOTE — ED Provider Notes (Signed)
[facility] Emergency Department Provider Note    First MD Initiated Contact with Patient 12/12/16 0408     (approximate)  I have reviewed the triage vital signs and the nursing notes.   HISTORY  Chief Complaint Allergic Reaction    HPI Eddie Garcia is a 21 y.o. male presents to emergency department stating allergic reaction to red dye". Patient states he had a rash to the bilateral arms back and stomach which started at 7 PM tonight. Patient states that he took 2 Benadryl at 7 PM. 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   Past surgical history None  Prior to Admission medications   Medication Sig Start Date End Date Taking? Authorizing Provider  cyclobenzaprine (FLEXERIL) 10 MG tablet Take 1 tablet (10 mg total) by mouth 3 (three) times daily as needed. Patient not taking: Reported on 11/27/2016 07/12/16   Darci Current, MD  cyproheptadine (PERIACTIN) 4 MG tablet Take 1 tablet (4 mg total) by mouth 3 (three) times daily as needed for allergies. Patient not taking: Reported on 11/27/2016 10/28/16   Charlesetta Ivory Menshew, PA-C  famotidine (PEPCID) 20 MG tablet Take 1 tablet (20 mg total) by mouth 2 (two) times daily. 10/28/16 11/07/16  Jenise V Bacon Menshew, PA-C  predniSONE (DELTASONE) 10 MG tablet Take 1 tablet (10 mg total) by mouth 2 (two) times daily with a meal. Patient not taking: Reported on 11/27/2016 10/28/16   Marisue Humble V Bacon Menshew, PA-C  predniSONE (DELTASONE) 20 MG tablet Take 3 tablets (60 mg total) by mouth daily. 12/12/16 12/17/16  Darci Current, MD    Allergies Red dye  No family history on file.  Social History Social History  Substance Use Topics  . Smoking status: Current Every Day Smoker    Packs/day: 0.25    Types: Cigarettes  . Smokeless tobacco: Never Used  . Alcohol use No    Review of  Systems Constitutional: No fever/chills Eyes: No visual changes. ENT: No sore throat. Cardiovascular: Denies chest pain. Respiratory: Denies shortness of breath. Gastrointestinal: No abdominal pain.  No nausea, no vomiting.  No diarrhea.  No constipation. Genitourinary: Negative for dysuria. Musculoskeletal: Negative for back pain. Skin: Positive for rash. Neurological: Negative for headaches, focal weakness or numbness.  10-point ROS otherwise negative.  ____________________________________________   PHYSICAL EXAM:  VITAL SIGNS: ED Triage Vitals  Enc Vitals Group     BP 12/11/16 2357 121/68     Pulse Rate 12/11/16 2357 97     Resp 12/11/16 2357 18     Temp 12/11/16 2357 98.3 F (36.8 C)     Temp Source 12/11/16 2357 Oral     SpO2 12/11/16 2357 100 %     Weight 12/11/16 2355 175 lb (79.4 kg)     Height 12/11/16 2355 6\' 4"  (1.93 m)     Head Circumference --      Peak Flow --      Pain Score --      Pain Loc --      Pain Edu? --      Excl. in GC? --     Constitutional: Alert and oriented. Well appearing and in no acute distress. Eyes: Conjunctivae are normal. PERRL. EOMI. Head: Atraumatic. Mouth/Throat: Mucous membranes are moist. Oropharynx non-erythematous. Neck: No stridor.   Cardiovascular: Normal rate, regular rhythm. Good peripheral circulation. Grossly normal heart  sounds. Respiratory: Normal respiratory effort.  No retractions. Lungs CTAB. Gastrointestinal: Soft and nontender. No distention.  Musculoskeletal: No lower extremity tenderness nor edema. No gross deformities of extremities. Neurologic:  Normal speech and language. No gross focal neurologic deficits are appreciated.  Skin:  Skin is warm, dry and intact. Urticarial rash anterior chest wall. Psychiatric: Mood and affect are normal. Speech and behavior are normal.   Procedures   ____________________________________________   INITIAL IMPRESSION / ASSESSMENT AND PLAN / ED COURSE  Pertinent labs  & imaging results that were available during my care of the patient were reviewed by me and considered in my medical decision making (see chart for details).  Patient states that he has a Epipen prescription      ____________________________________________  FINAL CLINICAL IMPRESSION(S) / ED DIAGNOSES  Final diagnoses:  Allergic reaction, initial encounter     MEDICATIONS GIVEN DURING THIS VISIT:  Medications - No data to display   NEW OUTPATIENT MEDICATIONS STARTED DURING THIS VISIT:  New Prescriptions   PREDNISONE (DELTASONE) 20 MG TABLET    Take 3 tablets (60 mg total) by mouth daily.    Modified Medications   No medications on file    Discontinued Medications   No medications on file     Note:  This document was prepared using Dragon voice recognition software and may include unintentional dictation errors.    Darci Currentandolph N Brown, MD 12/12/16 [phone]

## 2016-12-25 ENCOUNTER — Encounter: Payer: Self-pay | Admitting: Emergency Medicine

## 2016-12-25 ENCOUNTER — Emergency Department
Admission: EM | Admit: 2016-12-25 | Discharge: 2016-12-25 | Disposition: A | Discharge status: Home / Self Care | Payer: BLUE CROSS/BLUE SHIELD | Attending: Emergency Medicine | Admitting: Emergency Medicine

## 2016-12-25 DIAGNOSIS — L509 Urticaria, unspecified: Secondary | ICD-10-CM | POA: Insufficient documentation

## 2016-12-25 DIAGNOSIS — F17210 Nicotine dependence, cigarettes, uncomplicated: Secondary | ICD-10-CM | POA: Insufficient documentation

## 2016-12-25 DIAGNOSIS — F909 Attention-deficit hyperactivity disorder, unspecified type: Secondary | ICD-10-CM | POA: Insufficient documentation

## 2016-12-25 DIAGNOSIS — R21 Rash and other nonspecific skin eruption: Secondary | ICD-10-CM | POA: Diagnosis present

## 2016-12-25 HISTORY — DX: Attention-deficit hyperactivity disorder, unspecified type: F90.9

## 2016-12-25 HISTORY — DX: Bipolar disorder, unspecified: F31.9

## 2016-12-25 HISTORY — DX: Schizophrenia, unspecified: F20.9

## 2016-12-25 MED ORDER — TETANUS-DIPHTH-ACELL PERTUSSIS 5-2.5-18.5 LF-MCG/0.5 IM SUSP
INTRAMUSCULAR | Status: AC
  Filled 2016-12-25: qty 0.5

## 2016-12-25 MED ORDER — PREDNISONE 20 MG PO TABS
60.0000 mg | ORAL_TABLET | Freq: Once | ORAL | Status: AC
  Administered 2016-12-25: 60 mg via ORAL
  Filled 2016-12-25: qty 3

## 2016-12-25 MED ORDER — FAMOTIDINE 40 MG PO TABS: 40.0000 mg | ORAL_TABLET | Freq: Every evening | ORAL | 0 refills | Status: AC

## 2016-12-25 MED ORDER — PREDNISONE 20 MG PO TABS: 60.0000 mg | ORAL_TABLET | Freq: Every day | ORAL | 0 refills | Status: AC

## 2016-12-25 MED ORDER — FAMOTIDINE 20 MG PO TABS
40.0000 mg | ORAL_TABLET | Freq: Once | ORAL | Status: AC
  Administered 2016-12-25: 40 mg via ORAL
  Filled 2016-12-25: qty 2

## 2016-12-25 MED ORDER — BACITRACIN ZINC 500 UNIT/GM EX OINT
TOPICAL_OINTMENT | CUTANEOUS | Status: AC
  Filled 2016-12-25: qty 0.9

## 2016-12-25 MED ORDER — LIDOCAINE HCL (PF) 1 % IJ SOLN
INTRAMUSCULAR | Status: AC
  Filled 2016-12-25: qty 5

## 2016-12-25 NOTE — ED Notes (Signed)
Patient c/o rash to entire body. 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 Pt reports he continues to consume products that contain red dye. Pt denies pain. Patient reports he took 2 benadryl  Tablets at 1500, and 1900 on 4/8 with relief, however symptoms have since returned.

## 2016-12-25 NOTE — ED Provider Notes (Signed)
[facility] Emergency Department Provider Note   ____________________________________________   First MD Initiated Contact with Patient 12/25/16 [phone]     (approximate)  I have reviewed the triage vital signs and the nursing notes.   HISTORY  Chief Complaint Rash    HPI Eddie Garcia is a 21 y.o. male who comes into the hospital today due to an allergic reaction. The patient reports that he is allergic to red dye but it isn't almost everything. The patient reports that he has hives and he's itching. He has taken 100 mg of Benadryl today. 50 mg at 3 PM and then 50 mg at 7 PM. He reports that this reaction started at 6 AM. He denies any shortness of breath or facial swelling or throat swelling. He also doesn't feel as if his chest is tighter throat is closing. The patient has been seen multiple times in the past for similar symptoms. He is here today for evaluation and treatment.    Past Medical History:  Diagnosis Date  . ADHD   . Bipolar 1 disorder (HCC)   . Schizophrenia (HCC)     There are no active problems to display for this patient.   History reviewed. No pertinent surgical history.  Prior to Admission medications   Medication Sig Start Date End Date Taking? Authorizing Provider  famotidine (PEPCID) 40 MG tablet Take 1 tablet (40 mg total) by mouth every evening. 12/25/16 12/25/17  Rebecka Apley, MD  predniSONE (DELTASONE) 20 MG tablet Take 3 tablets (60 mg total) by mouth daily. 12/25/16   Rebecka Apley, MD    Allergies Red dye  History reviewed. No pertinent family history.  Social History Social History  Substance Use Topics  . Smoking status: Current Every Day Smoker    Packs/day: 0.25    Types: Cigarettes  . Smokeless tobacco: Never Used  . Alcohol use No    Review of Systems Constitutional: No fever/chills Eyes: No visual changes. ENT: No sore throat. Cardiovascular: Denies chest pain. Respiratory: Denies shortness of  breath. Gastrointestinal: No abdominal pain.  No nausea, no vomiting.  No diarrhea.  No constipation. Genitourinary: Negative for dysuria. Musculoskeletal: Negative for back pain. Skin: Hives Neurological: Negative for headaches, focal weakness or numbness.  10-point ROS otherwise negative.  ____________________________________________   PHYSICAL EXAM:  VITAL SIGNS: ED Triage Vitals  Enc Vitals Group     BP 12/25/16 0014 132/78     Pulse Rate 12/25/16 0014 83     Resp 12/25/16 0014 18     Temp 12/25/16 0014 98.1 F (36.7 C)     Temp Source 12/25/16 0014 Oral     SpO2 12/25/16 0014 100 %     Weight 12/25/16 0014 175 lb (79.4 kg)     Height 12/25/16 0014  (1.93 m)     Head Circumference --      Peak Flow --      Pain Score 12/25/16 0023 0     Pain Loc --      Pain Edu? --      Excl. in GC? --     Constitutional: Alert and oriented. Well appearing and in Mild distress. Eyes: Conjunctivae are normal. PERRL. EOMI. Head: Atraumatic. Nose: No congestion/rhinnorhea. Mouth/Throat: Mucous membranes are moist.  Oropharynx non-erythematous. Cardiovascular: Normal rate, regular rhythm. Grossly normal heart sounds.  Good peripheral circulation. Respiratory: Normal respiratory effort.  No retractions. Lungs CTAB. Gastrointestinal: Soft and nontender. No distention. Positive bowel sounds Musculoskeletal: No  lower extremity tenderness nor edema.   Neurologic:  Normal speech and language.  Skin:  Skin is warm, dry and intact. Hives to back and abdomen as well as to lower extremities.Marland Kitchen Psychiatric: Mood and affect are normal. Speech and behavior are normal.  ____________________________________________   LABS (all labs ordered are listed, but only abnormal results are displayed)  Labs Reviewed - No data to  display ____________________________________________  EKG  none ____________________________________________  RADIOLOGY  none ____________________________________________   PROCEDURES  Procedure(s) performed: None  Procedures  Critical Care performed: No  ____________________________________________   INITIAL IMPRESSION / ASSESSMENT AND PLAN / ED COURSE  Pertinent labs & imaging results that were available during my care of the patient were reviewed by me and considered in my medical decision making (see chart for details).  This is a 21 year old who comes into the hospital today with hives. The patient has been seen multiple times in the past. He is allergic to red dye and reports that it is difficult to avoid. Since the patient did have some Benadryl earlier today I will give him some prednisone as well as Pepcid. As he is not having any respiratory complaints we'll be discharged home to follow-up with the acute care clinic or his primary care physician.      ____________________________________________   FINAL CLINICAL IMPRESSION(S) / ED DIAGNOSES  Final diagnoses:  Hives      NEW MEDICATIONS STARTED DURING THIS VISIT:  New Prescriptions   FAMOTIDINE (PEPCID) 40 MG TABLET    Take 1 tablet (40 mg total) by mouth every evening.   PREDNISONE (DELTASONE) 20 MG TABLET    Take 3 tablets (60 mg total) by mouth daily.     Note:  This document was prepared using Dragon voice recognition software and may include unintentional dictation errors.    Rebecka Apley, MD 12/25/16 [phone]

## 2016-12-25 NOTE — ED Triage Notes (Addendum)
Pt here for scattered rash to entire body; itching; pt here on 12/12/16 for the same; no difficulty breathing; took  Benadryl at 7pm and says rash has improved; pt in no distress in triage; going through medications prescribed, pt had 3 meds prescribed for allergies on 10/28/16; pt says he did not get the prescriptions, and then says maybe he did and lost them;

## 2016-12-25 NOTE — ED Notes (Signed)
No answer when called for treatment room.  Unable to locate patient in lobby or just outside lobby.

## 2016-12-25 NOTE — Discharge Instructions (Signed)
Please follow-up with your primary care physician or with the acute care clinic. Please try as hard as he can to not exposure self to your allergen.

## 2017-01-04 ENCOUNTER — Encounter: Payer: Self-pay | Admitting: Emergency Medicine

## 2017-01-04 ENCOUNTER — Emergency Department
Admission: EM | Admit: 2017-01-04 | Discharge: 2017-01-05 | Disposition: A | Discharge status: Home / Self Care | Payer: BLUE CROSS/BLUE SHIELD | Attending: Emergency Medicine | Admitting: Emergency Medicine

## 2017-01-04 DIAGNOSIS — T7840XA Allergy, unspecified, initial encounter: Secondary | ICD-10-CM | POA: Diagnosis not present

## 2017-01-04 DIAGNOSIS — F17210 Nicotine dependence, cigarettes, uncomplicated: Secondary | ICD-10-CM | POA: Diagnosis not present

## 2017-01-04 DIAGNOSIS — F909 Attention-deficit hyperactivity disorder, unspecified type: Secondary | ICD-10-CM | POA: Insufficient documentation

## 2017-01-04 DIAGNOSIS — R21 Rash and other nonspecific skin eruption: Secondary | ICD-10-CM | POA: Diagnosis present

## 2017-01-04 MED ORDER — FAMOTIDINE 20 MG PO TABS: 20.0000 mg | ORAL_TABLET | Freq: Two times a day (BID) | ORAL | 0 refills | Status: AC

## 2017-01-04 MED ORDER — FAMOTIDINE 20 MG PO TABS
10.0000 mg | ORAL_TABLET | Freq: Once | ORAL | Status: AC
  Administered 2017-01-04: 10 mg via ORAL
  Filled 2017-01-04: qty 1

## 2017-01-04 MED ORDER — PREDNISONE 20 MG PO TABS: 60.0000 mg | ORAL_TABLET | Freq: Every day | ORAL | 0 refills | Status: AC

## 2017-01-04 NOTE — ED Provider Notes (Signed)
[facility] Emergency Department Provider Note  ____________________________________________  Time seen: Approximately 11:27 PM  I have reviewed the triage vital signs and the nursing notes.   HISTORY  Chief Complaint Rash    HPI Eddie Garcia is a 21 y.o. male that presents to emergency department with allergic reaction for 3 days. Rash itches. Rash is improving. He has been taking Benadryl every 4 hours.  Patient states that he has had this multiple times in the past and is here for a work note. 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 He has an appointment with his new PCP on Monday and is going to get a referral to an allergist. He has an EpiPen. He has been seen in ED several times for similar symptoms. He has had steroids for this several times in the past and does not want any more. He denies fever, facial swelling, chest tightness shortness of breath, chest pain, nausea, vomiting, abdominal pain.   Past Medical History:  Diagnosis Date  . ADHD   . Bipolar 1 disorder (HCC)   . Schizophrenia (HCC)     There are no active problems to display for this patient.   History reviewed. No pertinent surgical history.  Prior to Admission medications   Medication Sig Start Date End Date Taking? Authorizing Provider  famotidine (PEPCID) 20 MG tablet Take 1 tablet (20 mg total) by mouth 2 (two) times daily. 01/04/17 01/04/18  Enid Derry, PA-C  famotidine (PEPCID) 40 MG tablet Take 1 tablet (40 mg total) by mouth every evening. 12/25/16 12/25/17  Rebecka Apley, MD  predniSONE (DELTASONE) 20 MG tablet Take 3 tablets (60 mg total) by mouth daily. 12/25/16   Rebecka Apley, MD  predniSONE (DELTASONE) 20 MG tablet Take 3 tablets (60 mg total) by mouth daily with breakfast. 01/04/17 01/08/17  Enid Derry, PA-C    Allergies Red dye  No family history on file.  Social History Social History  Substance Use Topics  .  Smoking status: Current Every Day Smoker    Packs/day: 0.25    Types: Cigarettes  . Smokeless tobacco: Never Used  . Alcohol use No     Review of Systems  Constitutional: No fever/chills ENT: No upper respiratory complaints. Cardiovascular: No chest pain. Respiratory: No SOB. Gastrointestinal: No abdominal pain.  No nausea, no vomiting.  Musculoskeletal: Negative for musculoskeletal pain. Skin: Negative for abrasions, lacerations, ecchymosis. Positive for rash. Neurological: Negative for headaches, numbness or tingling   ____________________________________________   PHYSICAL EXAM:  VITAL SIGNS: ED Triage Vitals [01/04/17 2238]  Enc Vitals Group     BP (!) 158/80     Pulse Rate (!) 115     Resp 18     Temp 98.4 F (36.9 C)     Temp Source Oral     SpO2 100 %     Weight 175 lb (79.4 kg)     Height  (1.93 m)     Head Circumference      Peak Flow      Pain Score      Pain Loc      Pain Edu?      Excl. in GC?      Constitutional: Alert and oriented. Well appearing and in no acute distress. Eyes: Conjunctivae are normal. PERRL. EOMI. Head: Atraumatic.  ENT:      Ears:      Nose: No congestion/rhinnorhea.  Mouth/Throat: Mucous membranes are moist. Oropharynx non-erythematous. No swelling. Neck: No stridor. No swelling. Cardiovascular: Normal rate, regular rhythm.  Good peripheral circulation. Respiratory: Normal respiratory effort without tachypnea or retractions. Lungs CTAB. Good air entry to the bases with no decreased or absent breath sounds. Musculoskeletal: Full range of motion to all extremities. No gross deformities appreciated. Neurologic:  Normal speech and language. No gross focal neurologic deficits are appreciated.  Skin:  Skin is warm, dry and intact. Hives to bilateral arms and legs and trunk and back.   ____________________________________________   LABS (all labs ordered are listed, but only abnormal results are displayed)  Labs  Reviewed - No data to display ____________________________________________  EKG   ____________________________________________  RADIOLOGY  No results found.  ____________________________________________    PROCEDURES  Procedure(s) performed:    Procedures    Medications  famotidine (PEPCID) tablet 10 mg (not administered)     ____________________________________________   INITIAL IMPRESSION / ASSESSMENT AND PLAN / ED COURSE  Pertinent labs & imaging results that were available during my care of the patient were reviewed by me and considered in my medical decision making (see chart for details).  Review of the [place] CSRS was performed in accordance of the [facility] prior to dispensing any controlled drugs.     Patient's diagnosis is consistent with allergic reaction. Vital signs and exam are reassuring. He has been seen in the hospital for this several times in the past. 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 He does not want any medication today. He states the rash is improving and he just needs a work note. I will write him prescription for prednisone and Pepcid if anything changes. He will continue taking Benadryl. He agrees return to the emergency department if any symptoms change. Work note was provided. Patient has an appointment with his PCP on Monday and is going to be referred to an allergist. Patient is given ED precautions to return to the ED for any worsening or new symptoms.     ____________________________________________  FINAL CLINICAL IMPRESSION(S) / ED DIAGNOSES  Final diagnoses:  Allergic reaction, initial encounter      NEW MEDICATIONS STARTED DURING THIS VISIT:  New Prescriptions   FAMOTIDINE (PEPCID) 20 MG TABLET    Take 1 tablet (20 mg total) by mouth 2 (two) times daily.   PREDNISONE (DELTASONE) 20 MG TABLET    Take 3 tablets (60 mg total) by mouth daily with breakfast.        This chart was dictated using voice recognition software/Dragon.  Despite best efforts to proofread, errors can occur which can change the meaning. Any change was purely unintentional.    Enid Derry, PA-C 01/04/17 2353    Myrna Blazer, MD 01/05/17 0002

## 2017-01-04 NOTE — ED Notes (Signed)
See triage note, pt reports last 3 days he's had redness and itching to abd, pt reports taking benadryl without relief. Pt denies SHOB.

## 2017-01-04 NOTE — ED Triage Notes (Signed)
Patient with rash to abdomen times three days. Patient reports that he has been taking benadryl with no improvement.

## 2017-10-09 ENCOUNTER — Encounter: Payer: Self-pay | Admitting: Emergency Medicine

## 2017-10-09 ENCOUNTER — Emergency Department
Admission: EM | Admit: 2017-10-09 | Discharge: 2017-10-09 | Disposition: A | Discharge status: Home / Self Care | Payer: BLUE CROSS/BLUE SHIELD | Attending: Emergency Medicine | Admitting: Emergency Medicine

## 2017-10-09 ENCOUNTER — Other Ambulatory Visit: Payer: Self-pay

## 2017-10-09 DIAGNOSIS — F17210 Nicotine dependence, cigarettes, uncomplicated: Secondary | ICD-10-CM | POA: Insufficient documentation

## 2017-10-09 DIAGNOSIS — T783XXA Angioneurotic edema, initial encounter: Secondary | ICD-10-CM

## 2017-10-09 DIAGNOSIS — Z79899 Other long term (current) drug therapy: Secondary | ICD-10-CM | POA: Insufficient documentation

## 2017-10-09 DIAGNOSIS — T7840XA Allergy, unspecified, initial encounter: Secondary | ICD-10-CM | POA: Insufficient documentation

## 2017-10-09 IMAGING — CT CT ABD-PELV W/ CM
2 of 4 series · 16 of 46 positions shown, 18 images · IV contrast (iopamidol)
Comparison: No priors.

CLINICAL DATA: 21-year-old male with history of vomiting,
diaphoresis and abdominal pain since yesterday evening.

EXAM:
CT ABDOMEN AND PELVIS WITH CONTRAST
TECHNIQUE: Multidetector CT imaging of the abdomen and pelvis was performed
using the standard protocol following bolus administration of
intravenous contrast.
CONTRAST:  100mL CMFLZT-4CC IOPAMIDOL (CMFLZT-4CC) INJECTION 61%

[Series 2: routine abd/pel with · axial · 0.71mm/px · z∈[-508,-43]mm · 13 of 103 slices shown, 15 images]
[im 5/103  soft-tissue]
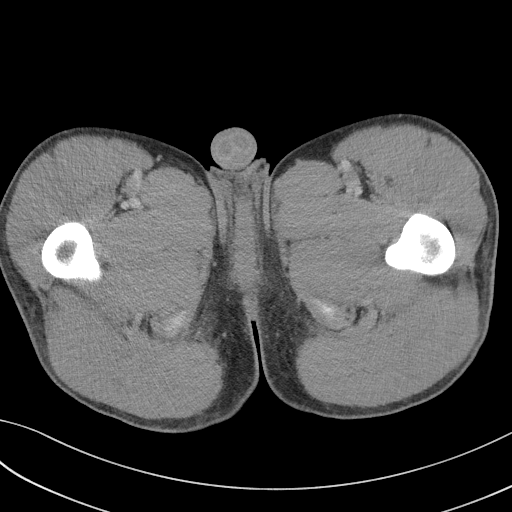
[im 5/103  bone]
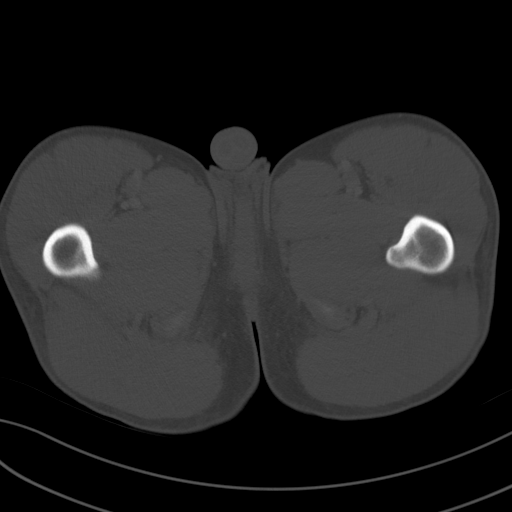
[im 13/103  soft-tissue]
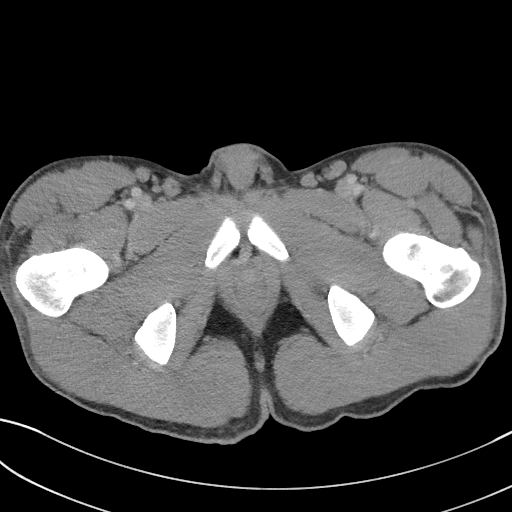
[im 22/103  soft-tissue]
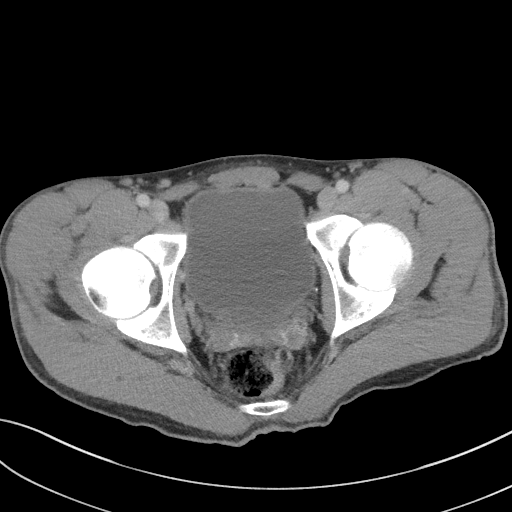
[im 30/103  soft-tissue]
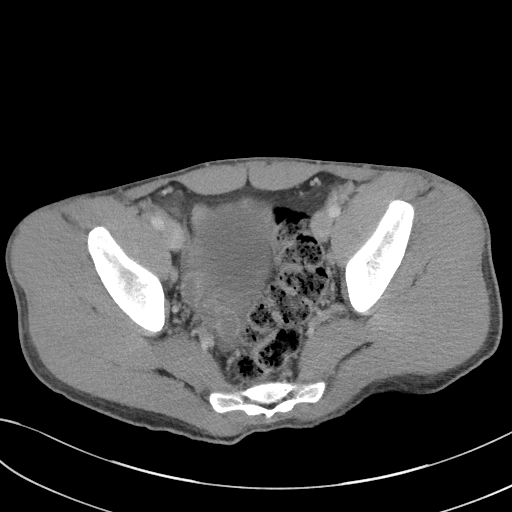
[im 35/103  soft-tissue]
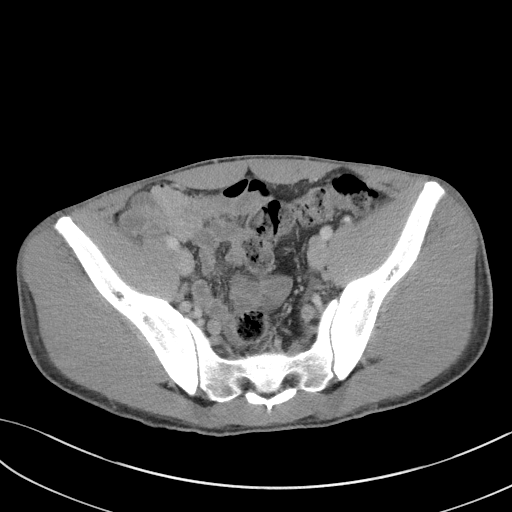
[im 43/103  soft-tissue]
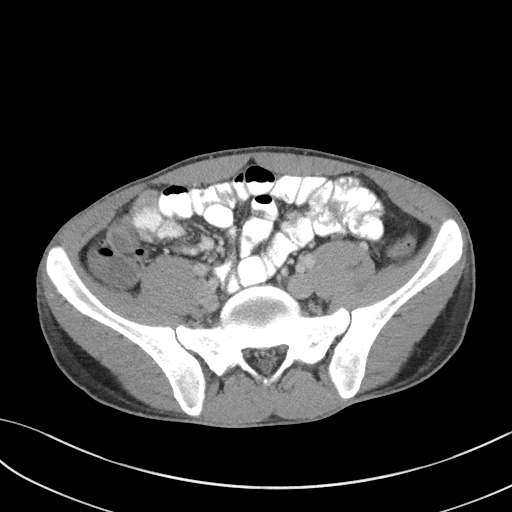
[im 52/103  soft-tissue]
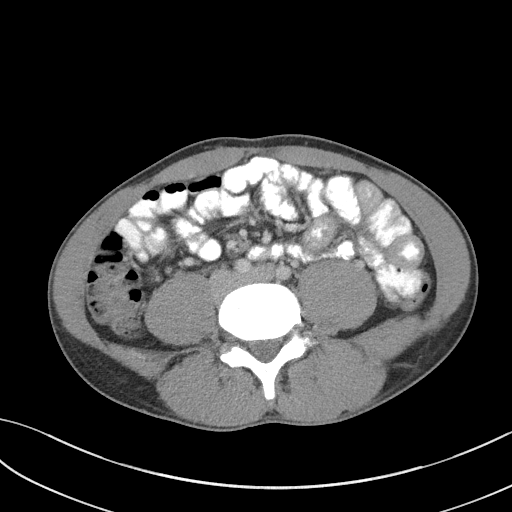
[im 60/103  soft-tissue]
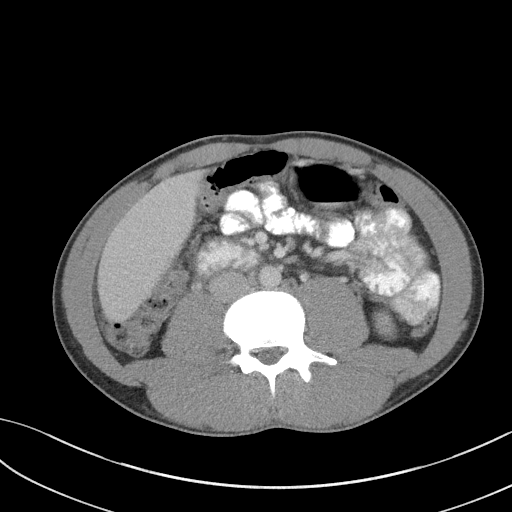
[im 69/103  soft-tissue]
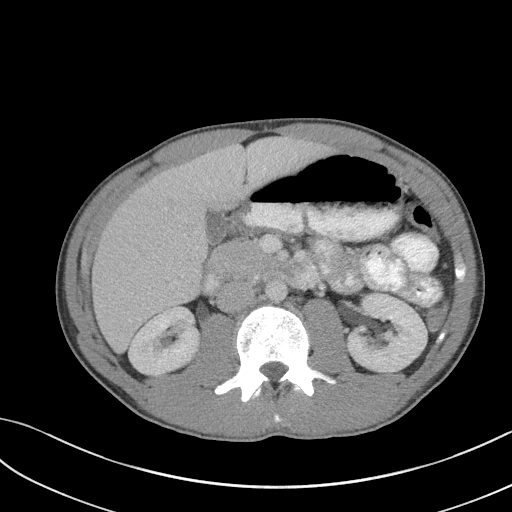
[im 69/103  bone]
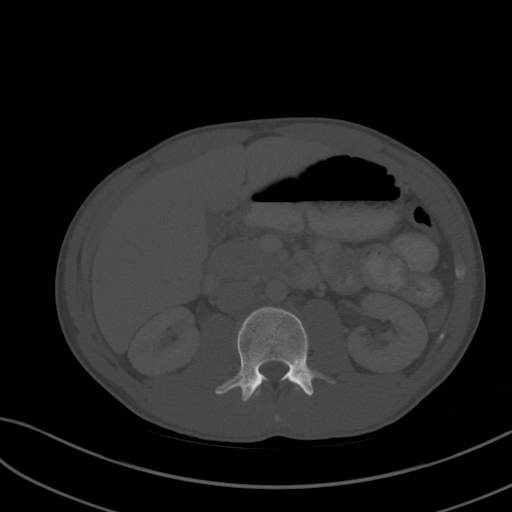
[im 73/103  soft-tissue]
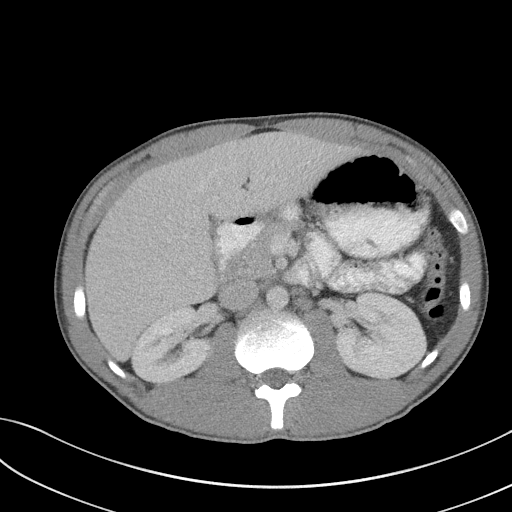
[im 81/103  soft-tissue]
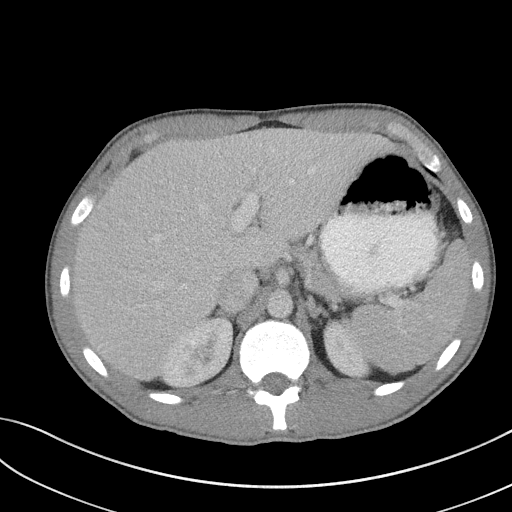
[im 90/103  soft-tissue]
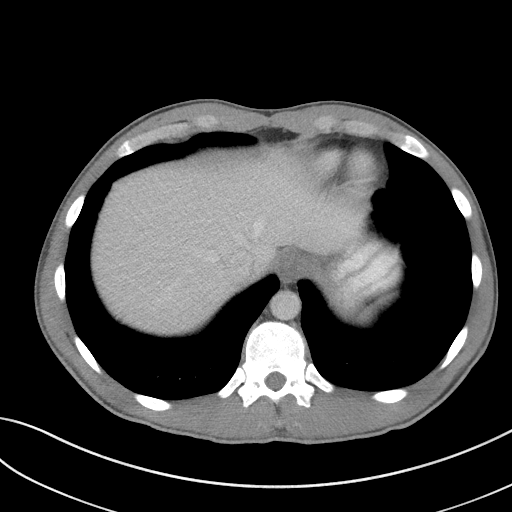
[im 98/103  soft-tissue]
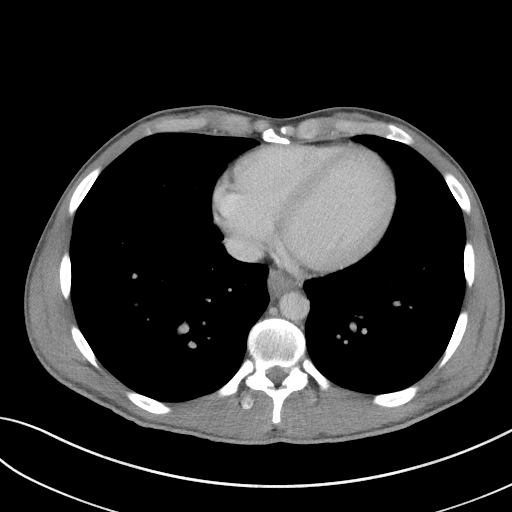

[Series 5: coronal st · coronal · 0.76mm/px · 3 of 75 slices shown]
[im 25/75  soft-tissue]
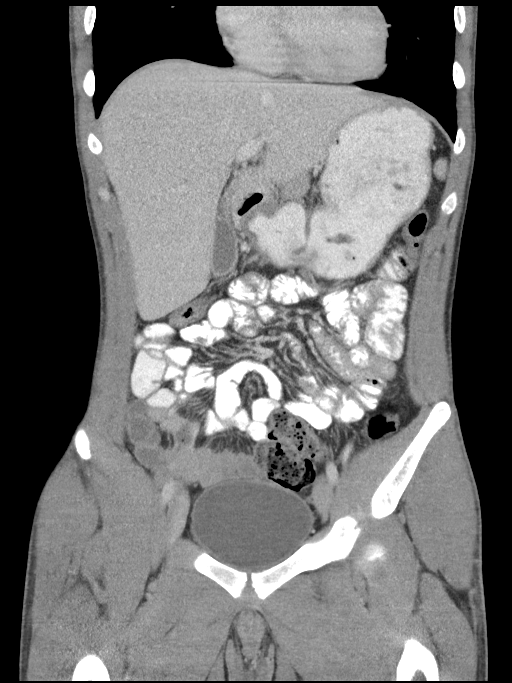
[im 33/75  soft-tissue]
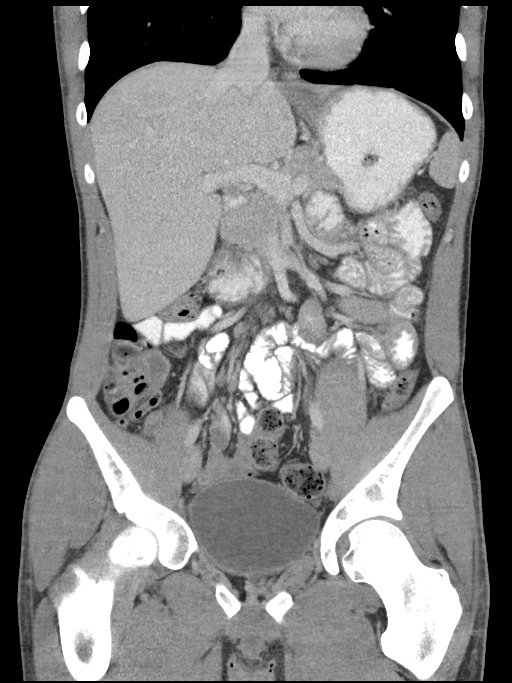
[im 42/75  soft-tissue]
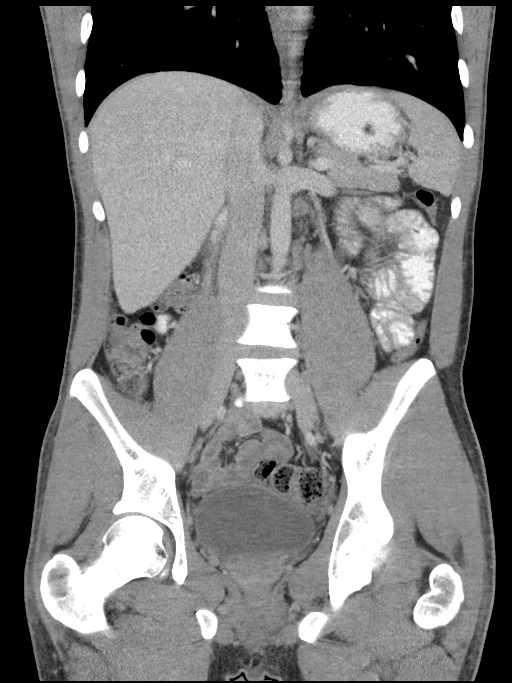

[16 of 46 positions shown; findings below may reference images not displayed]

FINDINGS: Lower chest: Unremarkable.

Hepatobiliary: No cystic or solid hepatic lesions. No intra or
extrahepatic biliary ductal dilatation. Gallbladder is normal in
appearance.

Pancreas: No pancreatic mass. No pancreatic ductal dilatation. No
pancreatic or peripancreatic fluid or inflammatory changes.

Spleen: Unremarkable.

Adrenals/Urinary Tract: Bilateral kidneys and bilateral adrenal
glands are normal in appearance. No hydroureteronephrosis. Urinary
bladder is normal in appearance.

Stomach/Bowel: The appearance of the stomach is normal. There is no
pathologic dilatation of small bowel or colon. Normal appendix.

Vascular/Lymphatic: No significant atherosclerotic disease, aneurysm
or dissection identified in the abdominal or pelvic vasculature. No
lymphadenopathy noted in the abdomen or pelvis.

Reproductive: Prostate gland seminal vesicles are unremarkable in
appearance.

Other: No significant volume of ascites.  No pneumoperitoneum.

Musculoskeletal: There are no aggressive appearing lytic or blastic
lesions noted in the visualized portions of the skeleton.
IMPRESSION: 1. No acute findings in the abdomen or pelvis to account for the
patient's symptoms.
2. Specifically, the appendix is normal.

## 2017-10-09 MED ORDER — DIPHENHYDRAMINE HCL 50 MG/ML IJ SOLN
50.0000 mg | Freq: Once | INTRAMUSCULAR | Status: AC
  Administered 2017-10-09: 50 mg via INTRAMUSCULAR
  Filled 2017-10-09 (×2): qty 1

## 2017-10-09 MED ORDER — CETIRIZINE HCL 10 MG PO TABS: 10.0000 mg | ORAL_TABLET | Freq: Every day | ORAL | 0 refills | Status: AC

## 2017-10-09 NOTE — ED Triage Notes (Signed)
Pt with allergic reaction to red dye, unsure of how he came in contact with it. Took benedryl at 0600 today. States it itches.

## 2017-10-09 NOTE — ED Provider Notes (Signed)
[facility] Emergency Department Provider Note   ____________________________________________   First MD Initiated Contact with Patient 10/09/17 1342     (approximate)  I have reviewed the triage vital signs and the nursing notes.   HISTORY  Chief Complaint Rash    HPI Eddie Garcia is a 22 y.o. male patient awakened today with edematous lips, rash, and itching.  Patient states he is allergic to red dye nausea-came in contact with her yesterday.  Patient state he did not want to take steroids either orally or by injection.  Patient denies anaphylactic signs and symptoms.  Patient is able to talk in full sentences.  Past Medical History:  Diagnosis Date  . ADHD   . Bipolar 1 disorder (HCC)   . Schizophrenia (HCC)     There are no active problems to display for this patient.   History reviewed. No pertinent surgical history.  Prior to Admission medications   Medication Sig Start Date End Date Taking? Authorizing Provider  cetirizine (ZYRTEC ALLERGY) 10 MG tablet Take 1 tablet (10 mg total) by mouth daily. 10/09/17   Joni Reining, PA-C  famotidine (PEPCID) 20 MG tablet Take 1 tablet (20 mg total) by mouth 2 (two) times daily. 01/04/17 01/04/18  Enid Derry, PA-C  famotidine (PEPCID) 40 MG tablet Take 1 tablet (40 mg total) by mouth every evening. 12/25/16 12/25/17  Rebecka Apley, MD  predniSONE (DELTASONE) 20 MG tablet Take 3 tablets (60 mg total) by mouth daily. 12/25/16   Rebecka Apley, MD    Allergies Red dye  No family history on file.  Social History Social History   Tobacco Use  . Smoking status: Current Every Day Smoker    Packs/day: 0.25    Types: Cigarettes  . Smokeless tobacco: Never Used  Substance Use Topics  . Alcohol use: No  . Drug use: No    Review of Systems Constitutional: No fever/chills Eyes: No visual changes. ENT: No sore throat. Cardiovascular: Denies chest pain. Respiratory: Denies shortness of  breath. Gastrointestinal: No abdominal pain.  No nausea, no vomiting.  No diarrhea.  No constipation. Genitourinary: Negative for dysuria. Musculoskeletal: Negative for back pain. Skin: Positive for rash and edematous oral lips  neurological: Negative for headaches, focal weakness or numbness. Allergic/Immunilogical: Red dye ____________________________________________   PHYSICAL EXAM:  VITAL SIGNS: ED Triage Vitals  Enc Vitals Group     BP 10/09/17 1327 117/85     Pulse Rate 10/09/17 1327 67     Resp --      Temp 10/09/17 1327 98.1 F (36.7 C)     Temp Source 10/09/17 1327 Oral     SpO2 10/09/17 1327 100 %     Weight 10/09/17 1328 175 lb (79.4 kg)     Height --      Head Circumference --      Peak Flow --      Pain Score --      Pain Loc --      Pain Edu? --      Excl. in GC? --    Constitutional: Alert and oriented. Well appearing and in no acute distress. Eyes: Conjunctivae are normal. PERRL. EOMI. Head: Atraumatic. Mouth/Throat: Edematous oral lips mucous membranes are moist.  Oropharynx non-erythematous. Neck: No stridor. Hematological/Lymphatic/Immunilogical: No cervical lymphadenopathy. Cardiovascular: Normal rate, regular rhythm. Grossly normal heart sounds.  Good peripheral circulation. Respiratory: Normal respiratory effort.  No retractions. Lungs CTAB. Skin:  Skin is warm, dry and intact. No  rash noted. Psychiatric: Mood and affect are normal. Speech and behavior are normal.  ____________________________________________   LABS (all labs ordered are listed, but only abnormal results are displayed)  Labs Reviewed - No data to display ____________________________________________  EKG   ____________________________________________  RADIOLOGY  No results found.  ____________________________________________   PROCEDURES  Procedure(s) performed: None  Procedures  Critical Care performed:  No  ____________________________________________   INITIAL IMPRESSION / ASSESSMENT AND PLAN / ED COURSE  As part of my medical decision making, I reviewed the following data within the electronic MEDICAL RECORD NUMBER    Angioedema and rash.  Patient refused steroidal medications.  Patient given discharge care instructions and a prescription for Atarax.  Patient [facility].     ____________________________________________   FINAL CLINICAL IMPRESSION(S) / ED DIAGNOSES  Final diagnoses:  Angioedema of lips, initial encounter  Allergic reaction, initial encounter     ED Discharge Orders        Ordered    cetirizine (ZYRTEC ALLERGY) 10 MG tablet  Daily     10/09/17 1355       Note:  This document was prepared using Dragon voice recognition software and may include unintentional dictation errors.    Joni ReiningSmith, Ronald K, PA-C 10/09/17 1356    Sharman CheekStafford, Phillip, MD 10/09/17 1525

## 2017-10-09 NOTE — ED Notes (Signed)
See triage note   Presents with generalized hives  States he is allergic to red dye  And gets hives daily  No resp distress noted

## 2018-05-12 ENCOUNTER — Encounter: Payer: Self-pay | Admitting: Emergency Medicine

## 2018-05-12 ENCOUNTER — Emergency Department: Payer: Self-pay

## 2018-05-12 ENCOUNTER — Other Ambulatory Visit: Payer: Self-pay

## 2018-05-12 ENCOUNTER — Emergency Department
Admission: EM | Admit: 2018-05-12 | Discharge: 2018-05-12 | Disposition: A | Discharge status: Home / Self Care | Payer: Self-pay | Attending: Emergency Medicine | Admitting: Emergency Medicine

## 2018-05-12 DIAGNOSIS — Z79899 Other long term (current) drug therapy: Secondary | ICD-10-CM | POA: Insufficient documentation

## 2018-05-12 DIAGNOSIS — W2209XA Striking against other stationary object, initial encounter: Secondary | ICD-10-CM | POA: Insufficient documentation

## 2018-05-12 DIAGNOSIS — M25531 Pain in right wrist: Secondary | ICD-10-CM | POA: Insufficient documentation

## 2018-05-12 DIAGNOSIS — Y998 Other external cause status: Secondary | ICD-10-CM | POA: Insufficient documentation

## 2018-05-12 DIAGNOSIS — M79641 Pain in right hand: Secondary | ICD-10-CM | POA: Insufficient documentation

## 2018-05-12 DIAGNOSIS — Y939 Activity, unspecified: Secondary | ICD-10-CM | POA: Insufficient documentation

## 2018-05-12 DIAGNOSIS — F17210 Nicotine dependence, cigarettes, uncomplicated: Secondary | ICD-10-CM | POA: Insufficient documentation

## 2018-05-12 DIAGNOSIS — Y929 Unspecified place or not applicable: Secondary | ICD-10-CM | POA: Insufficient documentation

## 2018-05-12 DIAGNOSIS — S6991XA Unspecified injury of right wrist, hand and finger(s), initial encounter: Secondary | ICD-10-CM
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
## 2018-05-12 NOTE — ED Notes (Signed)
Ice pack given
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
## 2018-05-12 NOTE — Discharge Instructions (Addendum)
X-ray of your right hand and wrist do not show any acute fracture.  We have provided you with an Ace wrap to use as needed for comfort.  You can also use ice, elevation and ibuprofen over-the-counter as needed.
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
## 2019-12-29 ENCOUNTER — Ambulatory Visit: Payer: Self-pay | Admitting: *Deleted

## 2019-12-29 NOTE — Telephone Encounter (Signed)
"  Patient requesting call back from NT to discuss loss of appetite and vomiting. Patient requesting to speak with RN to discuss how to alleviate symptoms and get advice. Patient does not have PCP."; he states that this has been happening for the past 4 - 5 days since he stopped smoking marijuana; the pt says this happens "right before he tries to eat"; he says it is from the smell of the food; the pt says he smoked marijuana for at least 10 years; the pt says his abdomen is slightly swollen, but he has allergies; recommendations made per nurse triage; he verbalized understanding and will go to Urgent Care.  Reason for Disposition . [1] Vomiting AND [2] abdomen looks much more swollen than usual  Answer Assessment - Initial Assessment Questions 1. VOMITING SEVERITY: "How many times have you vomited in the past 24 hours?"     - MILD:  1 - 2 times/day    - MODERATE: 3 - 5 times/day, decreased oral intake without significant weight loss or symptoms of dehydration    - SEVERE: 6 or more times/day, vomits everything or nearly everything, with significant weight loss, symptoms of dehydration      3 times on 12/28/19 2. ONSET: "When did the vomiting begin?"    12/25/19 3. FLUIDS: "What fluids or food have you vomited up today?" "Have you been able to keep any fluids down?"     yes 4. ABDOMINAL PAIN: "Are your having any abdominal pain?" If yes : "How bad is it and what does it feel like?" (e.g., crampy, dull, intermittent, constant)    Only when vomiting 5. DIARRHEA: "Is there any diarrhea?" If so, ask: "How many times today?"      no 6. CONTACTS: "Is there anyone else in the family with the same symptoms?"      no 7. CAUSE: "What do you think is causing your vomiting?"   ? Stopped smoking marijuana 8. HYDRATION STATUS: "Any signs of dehydration?" (e.g., dry mouth [not only dry lips], too weak to stand) "When did you last urinate?"    Yes; last urination 12/29/19 0900 9. OTHER SYMPTOMS: "Do you have any  other symptoms?" (e.g., fever, headache, vertigo, vomiting blood or coffee grounds, recent head injury)     no 10. PREGNANCY: "Is there any chance you are pregnant?" "When was your last menstrual period?"      n/a  Protocols used: [facility]

## 2020-09-26 ENCOUNTER — Other Ambulatory Visit: Payer: Self-pay

## 2020-09-26 DIAGNOSIS — Z20822 Contact with and (suspected) exposure to covid-19: Secondary | ICD-10-CM

## 2020-09-28 LAB — NOVEL CORONAVIRUS, NAA: SARS-CoV-2, NAA: NOT DETECTED

## 2020-09-28 LAB — SARS-COV-2, NAA 2 DAY TAT
